# Patient Record
Sex: Male | Born: 1980 | Race: White | Hispanic: No | Marital: Married | Smoking: Current every day smoker
Health system: Southern US, Community
[De-identification: ages and names within clinical notes are randomized; demographics above are authoritative.]

---

## 1999-03-12 ENCOUNTER — Emergency Department (HOSPITAL_COMMUNITY): Admission: EM | Admit: 1999-03-12 | Discharge: 1999-03-12 | Payer: Self-pay | Admitting: Emergency Medicine

## 1999-03-19 ENCOUNTER — Emergency Department (HOSPITAL_COMMUNITY): Admission: EM | Admit: 1999-03-19 | Discharge: 1999-03-19 | Payer: Self-pay | Admitting: Emergency Medicine

## 1999-03-19 ENCOUNTER — Encounter: Payer: Self-pay | Admitting: Emergency Medicine

## 2006-11-18 ENCOUNTER — Emergency Department (HOSPITAL_COMMUNITY): Admission: EM | Admit: 2006-11-18 | Discharge: 2006-11-19 | Payer: Self-pay | Admitting: Emergency Medicine

## 2010-09-26 ENCOUNTER — Emergency Department (HOSPITAL_COMMUNITY): Admission: AC | Admit: 2010-09-26 | Discharge: 2010-09-26 | Payer: Self-pay

## 2011-02-24 LAB — DIFFERENTIAL
Eosinophils Absolute: 0.2 10*3/uL (ref 0.0–0.7)
Lymphs Abs: 2.8 10*3/uL (ref 0.7–4.0)
Neutro Abs: 7.8 10*3/uL — ABNORMAL HIGH (ref 1.7–7.7)
Neutrophils Relative %: 68 % (ref 43–77)

## 2011-02-24 LAB — CBC
HCT: 42 % (ref 39.0–52.0)
MCV: 90.5 fL (ref 78.0–100.0)
Platelets: 231 10*3/uL (ref 150–400)

## 2011-02-24 LAB — POCT I-STAT, CHEM 8
Glucose, Bld: 106 mg/dL — ABNORMAL HIGH (ref 70–99)
Potassium: 4.1 meq/L (ref 3.5–5.1)
Sodium: 137 meq/L (ref 135–145)

## 2020-04-10 ENCOUNTER — Emergency Department (HOSPITAL_COMMUNITY): Payer: Self-pay

## 2020-04-10 ENCOUNTER — Encounter (HOSPITAL_COMMUNITY): Payer: Self-pay | Admitting: Student in an Organized Health Care Education/Training Program

## 2020-04-10 ENCOUNTER — Other Ambulatory Visit: Payer: Self-pay

## 2020-04-10 ENCOUNTER — Emergency Department (HOSPITAL_COMMUNITY)
Admission: EM | Admit: 2020-04-10 | Discharge: 2020-04-11 | Disposition: A | Discharge status: Home / Self Care | Payer: Self-pay | Attending: Emergency Medicine | Admitting: Emergency Medicine

## 2020-04-10 DIAGNOSIS — S20411A Abrasion of right back wall of thorax, initial encounter: Secondary | ICD-10-CM | POA: Insufficient documentation

## 2020-04-10 DIAGNOSIS — F1290 Cannabis use, unspecified, uncomplicated: Secondary | ICD-10-CM | POA: Diagnosis not present

## 2020-04-10 DIAGNOSIS — F17210 Nicotine dependence, cigarettes, uncomplicated: Secondary | ICD-10-CM | POA: Insufficient documentation

## 2020-04-10 DIAGNOSIS — S40811A Abrasion of right upper arm, initial encounter: Secondary | ICD-10-CM | POA: Insufficient documentation

## 2020-04-10 DIAGNOSIS — Y929 Unspecified place or not applicable: Secondary | ICD-10-CM | POA: Diagnosis not present

## 2020-04-10 DIAGNOSIS — S2242XA Multiple fractures of ribs, left side, initial encounter for closed fracture: Secondary | ICD-10-CM | POA: Diagnosis not present

## 2020-04-10 DIAGNOSIS — M546 Pain in thoracic spine: Secondary | ICD-10-CM | POA: Insufficient documentation

## 2020-04-10 DIAGNOSIS — Z789 Other specified health status: Secondary | ICD-10-CM

## 2020-04-10 DIAGNOSIS — Y939 Activity, unspecified: Secondary | ICD-10-CM | POA: Insufficient documentation

## 2020-04-10 DIAGNOSIS — Y999 Unspecified external cause status: Secondary | ICD-10-CM | POA: Insufficient documentation

## 2020-04-10 DIAGNOSIS — T07XXXA Unspecified multiple injuries, initial encounter: Secondary | ICD-10-CM | POA: Diagnosis present

## 2020-04-10 DIAGNOSIS — Z23 Encounter for immunization: Secondary | ICD-10-CM | POA: Insufficient documentation

## 2020-04-10 LAB — COMPREHENSIVE METABOLIC PANEL
ALT: 75 U/L — ABNORMAL HIGH (ref 0–44)
AST: 87 U/L — ABNORMAL HIGH (ref 15–41)
Albumin: 3.9 g/dL (ref 3.5–5.0)
Alkaline Phosphatase: 87 U/L (ref 38–126)
Anion gap: 10 (ref 5–15)
BUN: 16 mg/dL (ref 6–20)
CO2: 25 mmol/L (ref 22–32)
Calcium: 9.3 mg/dL (ref 8.9–10.3)
Chloride: 105 mmol/L (ref 98–111)
Creatinine, Ser: 1.22 mg/dL (ref 0.61–1.24)
GFR calc Af Amer: >60 mL/min (ref >60)
GFR calc non Af Amer: >60 mL/min (ref >60)
Glucose, Bld: 155 mg/dL — ABNORMAL HIGH (ref 70–99)
Potassium: 4.1 mmol/L (ref 3.5–5.1)
Sodium: 140 mmol/L (ref 135–145)
Total Bilirubin: 0.6 mg/dL (ref 0.3–1.2)
Total Protein: 7 g/dL (ref 6.5–8.1)

## 2020-04-10 LAB — CBC
HCT: 49 % (ref 39.0–52.0)
Hemoglobin: 15.8 g/dL (ref 13.0–17.0)
MCH: 30.3 pg (ref 26.0–34.0)
MCHC: 32.2 g/dL (ref 30.0–36.0)
MCV: 94 fL (ref 80.0–100.0)
Platelets: 279 10*3/uL (ref 150–400)
RBC: 5.21 MIL/uL (ref 4.22–5.81)
RDW: 12.6 % (ref 11.5–15.5)
WBC: 24 10*3/uL — ABNORMAL HIGH (ref 4.0–10.5)
nRBC: 0 % (ref 0.0–0.2)

## 2020-04-10 LAB — LACTIC ACID, PLASMA: Lactic Acid, Venous: 2.3 mmol/L (ref 0.5–1.9)

## 2020-04-10 LAB — I-STAT CHEM 8, ED
BUN: 21 mg/dL — ABNORMAL HIGH (ref 6–20)
Calcium, Ion: 1.05 mmol/L — ABNORMAL LOW (ref 1.15–1.40)
Chloride: 105 mmol/L (ref 98–111)
Creatinine, Ser: 1.1 mg/dL (ref 0.61–1.24)
Glucose, Bld: 141 mg/dL — ABNORMAL HIGH (ref 70–99)
HCT: 49 % (ref 39.0–52.0)
Hemoglobin: 16.7 g/dL (ref 13.0–17.0)
Potassium: 4 mmol/L (ref 3.5–5.1)
Sodium: 141 mmol/L (ref 135–145)
TCO2: 27 mmol/L (ref 22–32)

## 2020-04-10 LAB — SAMPLE TO BLOOD BANK

## 2020-04-10 LAB — PROTIME-INR
INR: 0.9 (ref 0.8–1.2)
Prothrombin Time: 12.1 seconds (ref 11.4–15.2)

## 2020-04-10 LAB — ETHANOL: Alcohol, Ethyl (B): <10 mg/dL (ref <10)

## 2020-04-10 MED ORDER — CEFAZOLIN SODIUM-DEXTROSE 2-4 GM/100ML-% IV SOLN
2.0000 g | Freq: Once | INTRAVENOUS | Status: AC
  Administered 2020-04-10: 20:00:00 2 g via INTRAVENOUS

## 2020-04-10 MED ORDER — OXYCODONE-ACETAMINOPHEN 5-325 MG PO TABS
1.0000 | ORAL_TABLET | Freq: Once | ORAL | Status: AC
  Administered 2020-04-10: 1 via ORAL
  Filled 2020-04-10: qty 1

## 2020-04-10 MED ORDER — TETANUS-DIPHTH-ACELL PERTUSSIS 5-2.5-18.5 LF-MCG/0.5 IM SUSP
0.5000 mL | Freq: Once | INTRAMUSCULAR | Status: AC
  Administered 2020-04-10: 0.5 mL via INTRAMUSCULAR

## 2020-04-10 MED ORDER — ONDANSETRON HCL 4 MG/2ML IJ SOLN
4.0000 mg | Freq: Once | INTRAMUSCULAR | Status: AC
  Administered 2020-04-10: 4 mg via INTRAVENOUS
  Filled 2020-04-10: qty 2

## 2020-04-10 MED ORDER — FENTANYL CITRATE (PF) 100 MCG/2ML IJ SOLN: 50.0000 ug | Freq: Once | INTRAMUSCULAR | Status: DC

## 2020-04-10 MED ORDER — HYDROMORPHONE HCL 1 MG/ML IJ SOLN
1.0000 mg | Freq: Once | INTRAMUSCULAR | Status: AC
  Administered 2020-04-10: 1 mg via INTRAVENOUS

## 2020-04-10 MED ORDER — IOHEXOL 300 MG/ML  SOLN
100.0000 mL | Freq: Once | INTRAMUSCULAR | Status: AC | PRN
  Administered 2020-04-10: 100 mL via INTRAVENOUS

## 2020-04-10 MED ORDER — HYDROCODONE-ACETAMINOPHEN 5-325 MG PO TABS: 1.0000 | ORAL_TABLET | ORAL | 0 refills | Status: AC | PRN

## 2020-04-10 NOTE — ED Notes (Signed)
Contacted CT Fraser Din) to add in T-spine and L-spine no charge orders with CT chest and abd
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
## 2024-01-06 DIAGNOSIS — F1420 Cocaine dependence, uncomplicated: Secondary | ICD-10-CM | POA: Diagnosis not present

## 2024-02-01 ENCOUNTER — Encounter (HOSPITAL_COMMUNITY): Payer: Self-pay | Admitting: Emergency Medicine

## 2024-02-01 ENCOUNTER — Other Ambulatory Visit: Payer: Self-pay

## 2024-02-01 ENCOUNTER — Emergency Department (HOSPITAL_COMMUNITY): Payer: 59

## 2024-02-01 ENCOUNTER — Emergency Department (HOSPITAL_COMMUNITY)
Admission: EM | Admit: 2024-02-01 | Discharge: 2024-02-02 | Disposition: A | Discharge status: Home / Self Care | Payer: 59 | Attending: Emergency Medicine | Admitting: Emergency Medicine

## 2024-02-01 DIAGNOSIS — Y908 Blood alcohol level of 240 mg/100 ml or more: Secondary | ICD-10-CM | POA: Insufficient documentation

## 2024-02-01 DIAGNOSIS — Y92410 Unspecified street and highway as the place of occurrence of the external cause: Secondary | ICD-10-CM | POA: Diagnosis not present

## 2024-02-01 DIAGNOSIS — S0181XA Laceration without foreign body of other part of head, initial encounter: Secondary | ICD-10-CM | POA: Diagnosis not present

## 2024-02-01 DIAGNOSIS — F1010 Alcohol abuse, uncomplicated: Secondary | ICD-10-CM | POA: Diagnosis not present

## 2024-02-01 DIAGNOSIS — S0990XA Unspecified injury of head, initial encounter: Secondary | ICD-10-CM | POA: Diagnosis present

## 2024-02-01 DIAGNOSIS — S0083XA Contusion of other part of head, initial encounter: Secondary | ICD-10-CM

## 2024-02-01 LAB — COMPREHENSIVE METABOLIC PANEL
ALT: 54 U/L — ABNORMAL HIGH (ref 0–44)
AST: 43 U/L — ABNORMAL HIGH (ref 15–41)
Albumin: 4.4 g/dL (ref 3.5–5.0)
Alkaline Phosphatase: 116 U/L (ref 38–126)
Anion gap: 13 (ref 5–15)
BUN: 10 mg/dL (ref 6–20)
CO2: 22 mmol/L (ref 22–32)
Calcium: 8.9 mg/dL (ref 8.9–10.3)
Chloride: 108 mmol/L (ref 98–111)
Creatinine, Ser: 0.95 mg/dL (ref 0.61–1.24)
GFR, Estimated: >60 mL/min (ref >60)
Glucose, Bld: 121 mg/dL — ABNORMAL HIGH (ref 70–99)
Potassium: 3.8 mmol/L (ref 3.5–5.1)
Sodium: 143 mmol/L (ref 135–145)
Total Bilirubin: 0.4 mg/dL (ref 0.0–1.2)
Total Protein: 7.8 g/dL (ref 6.5–8.1)

## 2024-02-01 LAB — CBC
HCT: 51.5 % (ref 39.0–52.0)
Hemoglobin: 17.2 g/dL — ABNORMAL HIGH (ref 13.0–17.0)
MCH: 30.1 pg (ref 26.0–34.0)
MCHC: 33.4 g/dL (ref 30.0–36.0)
MCV: 90 fL (ref 80.0–100.0)
Platelets: 331 10*3/uL (ref 150–400)
RBC: 5.72 MIL/uL (ref 4.22–5.81)
RDW: 12.4 % (ref 11.5–15.5)
WBC: 13.2 10*3/uL — ABNORMAL HIGH (ref 4.0–10.5)
nRBC: 0 % (ref 0.0–0.2)

## 2024-02-01 LAB — I-STAT CHEM 8, ED
BUN: 13 mg/dL (ref 6–20)
Calcium, Ion: 1.07 mmol/L — ABNORMAL LOW (ref 1.15–1.40)
Chloride: 108 mmol/L (ref 98–111)
Creatinine, Ser: 1.3 mg/dL — ABNORMAL HIGH (ref 0.61–1.24)
Glucose, Bld: 119 mg/dL — ABNORMAL HIGH (ref 70–99)
HCT: 54 % — ABNORMAL HIGH (ref 39.0–52.0)
Hemoglobin: 18.4 g/dL — ABNORMAL HIGH (ref 13.0–17.0)
Potassium: 4.4 mmol/L (ref 3.5–5.1)
Sodium: 145 mmol/L (ref 135–145)
TCO2: 26 mmol/L (ref 22–32)

## 2024-02-01 LAB — ETHANOL: Alcohol, Ethyl (B): 284 mg/dL — ABNORMAL HIGH (ref <10)

## 2024-02-01 MED ORDER — HALOPERIDOL LACTATE 5 MG/ML IJ SOLN
5.0000 mg | Freq: Once | INTRAMUSCULAR | Status: AC
  Administered 2024-02-01: 5 mg via INTRAVENOUS
  Filled 2024-02-01: qty 1

## 2024-02-01 MED ORDER — IOHEXOL 350 MG/ML SOLN
75.0000 mL | Freq: Once | INTRAVENOUS | Status: AC | PRN
  Administered 2024-02-01: 75 mL via INTRAVENOUS

## 2024-02-01 MED ORDER — SODIUM CHLORIDE 0.9 % IV BOLUS
1000.0000 mL | Freq: Once | INTRAVENOUS | Status: AC
  Administered 2024-02-01: 1000 mL via INTRAVENOUS
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
## 2024-02-01 NOTE — ED Notes (Signed)
 Pt returned from CT
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
## 2024-02-01 NOTE — ED Notes (Signed)
 Pt transported to CT ?

## 2024-02-01 NOTE — ED Notes (Signed)
 Christian, PA arrived at bedside

## 2024-02-01 NOTE — ED Notes (Signed)
 Pt took off c-collar after multiple attempts to keep it on.

## 2024-02-02 NOTE — Discharge Instructions (Signed)
 Please use Tylenol or ibuprofen for pain.  You may use 600 mg ibuprofen every 6 hours or 1000 mg of Tylenol every 6 hours.  You may choose to alternate between the 2.  This would be most effective.  Not to exceed 4 g of Tylenol within 24 hours.  Not to exceed 3200 mg ibuprofen 24 hours.  Please keep the wound on your forehead covered with a bandage, apply a thin layer of Polysporin, Neosporin after cleansing gently with soap and change the bandage at least once daily.  If you notice new redness, swelling, pus draining from the affected site please return for further evaluation of possible infection.  The swelling of the forehead should decrease over time.  I have attached some resources to help quit drinking.

## 2024-02-02 NOTE — ED Notes (Signed)
 Pt yelling/screaming and cursing at staff at this time. This RN has tried multiple attempts of verbal de-escalation for patient. Pt still continues to yell and curse.
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
